# Patient Record
Sex: Male | Born: 1980 | Race: White | Hispanic: No | Marital: Single
Health system: Southern US, Community
[De-identification: ages and names within clinical notes are randomized; demographics above are authoritative.]

## PROBLEM LIST (undated history)

## (undated) DIAGNOSIS — M4800 Spinal stenosis, site unspecified: Secondary | ICD-10-CM

## (undated) DIAGNOSIS — G809 Cerebral palsy, unspecified: Secondary | ICD-10-CM

---
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
## 2002-01-19 ENCOUNTER — Encounter: Admission: RE | Admit: 2002-01-19 | Discharge: 2002-01-19 | Payer: Self-pay | Admitting: Psychiatry

## 2002-08-10 ENCOUNTER — Ambulatory Visit (HOSPITAL_COMMUNITY): Admission: RE | Admit: 2002-08-10 | Discharge: 2002-08-10 | Payer: Self-pay | Admitting: Pediatrics

## 2002-08-10 ENCOUNTER — Encounter: Payer: Self-pay | Admitting: Pediatrics

## 2004-08-21 ENCOUNTER — Encounter: Payer: Self-pay | Admitting: Internal Medicine

## 2004-10-02 ENCOUNTER — Ambulatory Visit (HOSPITAL_COMMUNITY): Payer: Self-pay | Admitting: Psychiatry

## 2005-01-01 ENCOUNTER — Ambulatory Visit (HOSPITAL_COMMUNITY): Payer: Self-pay | Admitting: Psychiatry

## 2005-06-26 ENCOUNTER — Ambulatory Visit (HOSPITAL_COMMUNITY): Payer: Self-pay | Admitting: Psychiatry

## 2005-06-26 ENCOUNTER — Ambulatory Visit (HOSPITAL_COMMUNITY): Admission: RE | Admit: 2005-06-26 | Discharge: 2005-06-26 | Payer: Self-pay | Admitting: Psychiatry

## 2005-08-22 ENCOUNTER — Ambulatory Visit (HOSPITAL_COMMUNITY): Payer: Self-pay | Admitting: Psychiatry

## 2005-10-28 ENCOUNTER — Ambulatory Visit (HOSPITAL_COMMUNITY): Payer: Self-pay | Admitting: Psychiatry

## 2005-12-04 ENCOUNTER — Ambulatory Visit (HOSPITAL_COMMUNITY): Admission: RE | Admit: 2005-12-04 | Discharge: 2005-12-04 | Payer: Self-pay | Admitting: Pediatrics

## 2005-12-30 ENCOUNTER — Ambulatory Visit (HOSPITAL_COMMUNITY): Payer: Self-pay | Admitting: Psychiatry

## 2006-04-07 ENCOUNTER — Ambulatory Visit (HOSPITAL_COMMUNITY): Payer: Self-pay | Admitting: Psychiatry

## 2007-01-08 ENCOUNTER — Encounter: Payer: Self-pay | Admitting: Internal Medicine

## 2007-02-25 ENCOUNTER — Ambulatory Visit (HOSPITAL_COMMUNITY): Payer: Self-pay | Admitting: Psychiatry

## 2007-05-25 ENCOUNTER — Ambulatory Visit: Payer: Self-pay | Admitting: Internal Medicine

## 2007-05-25 DIAGNOSIS — R569 Unspecified convulsions: Secondary | ICD-10-CM

## 2007-05-25 DIAGNOSIS — G809 Cerebral palsy, unspecified: Secondary | ICD-10-CM | POA: Insufficient documentation

## 2007-05-25 DIAGNOSIS — F329 Major depressive disorder, single episode, unspecified: Secondary | ICD-10-CM

## 2007-05-25 DIAGNOSIS — F909 Attention-deficit hyperactivity disorder, unspecified type: Secondary | ICD-10-CM | POA: Insufficient documentation

## 2007-06-02 ENCOUNTER — Ambulatory Visit (HOSPITAL_COMMUNITY): Payer: Self-pay | Admitting: Psychiatry

## 2007-06-03 ENCOUNTER — Encounter: Payer: Self-pay | Admitting: Internal Medicine

## 2007-06-08 ENCOUNTER — Ambulatory Visit (HOSPITAL_COMMUNITY): Admission: RE | Admit: 2007-06-08 | Discharge: 2007-06-08 | Payer: Self-pay | Admitting: Pediatrics

## 2007-06-22 ENCOUNTER — Encounter: Payer: Self-pay | Admitting: Internal Medicine

## 2007-07-08 ENCOUNTER — Telehealth: Payer: Self-pay | Admitting: Internal Medicine

## 2007-07-22 ENCOUNTER — Ambulatory Visit (HOSPITAL_COMMUNITY): Admission: RE | Admit: 2007-07-22 | Discharge: 2007-07-22 | Payer: Self-pay | Admitting: Pediatrics

## 2007-09-15 ENCOUNTER — Encounter: Payer: Self-pay | Admitting: Internal Medicine

## 2007-09-24 LAB — CONVERTED CEMR LAB
Basophils Absolute: 0 10*3/uL (ref 0.0–0.1)
Basophils Relative: 0 % (ref 0–1)
CO2: 29 meq/L (ref 19–32)
Carbamazepine Lvl: 7.8 ug/mL (ref 4.0–12.0)
Chloride: 104 meq/L (ref 96–112)
Cholesterol: 176 mg/dL (ref 0–200)
Creatinine, Ser: 0.94 mg/dL (ref 0.40–1.50)
Glucose, Bld: 74 mg/dL (ref 70–99)
HDL: 49 mg/dL (ref >39)
Hemoglobin: 16.8 g/dL (ref 13.0–17.0)
Lymphocytes Relative: 35 % (ref 12–46)
Lymphs Abs: 1.7 10*3/uL (ref 0.7–4.0)
Monocytes Relative: 9 % (ref 3–12)
Neutrophils Relative %: 54 % (ref 43–77)
Platelets: 237 10*3/uL (ref 150–400)
Potassium: 3.7 meq/L (ref 3.5–5.3)
RBC: 5.26 M/uL (ref 4.22–5.81)
RDW: 12.2 % (ref 11.5–15.5)
Total Bilirubin: 0.7 mg/dL (ref 0.3–1.2)
Total CHOL/HDL Ratio: 3.6
VLDL: 16 mg/dL (ref 0–40)

## 2007-10-05 ENCOUNTER — Ambulatory Visit (HOSPITAL_COMMUNITY): Payer: Self-pay | Admitting: Psychiatry

## 2008-01-24 ENCOUNTER — Ambulatory Visit (HOSPITAL_COMMUNITY): Payer: Self-pay | Admitting: Psychiatry

## 2008-04-17 ENCOUNTER — Encounter: Payer: Self-pay | Admitting: Internal Medicine

## 2010-05-30 ENCOUNTER — Telehealth: Payer: Self-pay | Admitting: *Deleted

## 2010-11-10 ENCOUNTER — Encounter: Payer: Self-pay | Admitting: Pediatrics

## 2010-11-19 NOTE — Letter (Signed)
Summary: On-Call Note  On-Call Note   Imported By: Maryln Gottron 04/03/2010 10:31:45  _____________________________________________________________________  External Attachment:    Type:   Image     Comment:   External Document

## 2010-11-19 NOTE — Letter (Signed)
Summary: Telephone Triage Note-Headache and Fever  Telephone Triage Note-Headache and Fever   Imported By: Maryln Gottron 04/03/2010 10:35:10  _____________________________________________________________________  External Attachment:    Type:   Image     Comment:   External Document

## 2010-11-19 NOTE — Progress Notes (Signed)
Summary: Note for jury duty  Phone Note Call from Patient Call back at Home Phone [phone]   Caller: Patient Summary of Call: Pt faxed over a note saying that he was summons for jury duty. He states that he doesn't feel capable due to his physical and intellectual challenges. He feels that he would have trouble staying focused and staying attentive. He has trouble remembering many things at one time, and feels that he would not be capable of making good decisions. He has difficulty following directions, so he would also have difficulty finding the courthouse, jury room, etc. He is wanting you to write a letter asking that he be excused from court due to his severe ADHD? He would appreciate it, because this makes him feel very uncomfortable. He will be happy to pick up the letter at our office. He needs to mail the letter by the end of Aug.  Initial call taken by: Romualdo Bolk, CMA Duncan Dull),  May 30, 2010 5:07 PM  Follow-up for Phone Call        Advise he  contat one of his specialists who have been caring for him    to write him a jury excuse as I  have not seen him in 3 years.   Follow-up by: Madelin Headings MD,  June 03, 2010 10:36 PM  Additional Follow-up for Phone Call Additional follow up Details #1::        Left message on machine about this. Additional Follow-up by: Romualdo Bolk, CMA (AAMA),  June 04, 2010 8:56 AM

## 2014-11-27 ENCOUNTER — Other Ambulatory Visit (HOSPITAL_COMMUNITY): Payer: Self-pay | Admitting: Pharmacist

## 2014-11-27 ENCOUNTER — Other Ambulatory Visit (HOSPITAL_COMMUNITY): Payer: Self-pay | Admitting: Internal Medicine

## 2014-11-27 DIAGNOSIS — S0990XS Unspecified injury of head, sequela: Secondary | ICD-10-CM

## 2014-11-28 ENCOUNTER — Emergency Department (HOSPITAL_COMMUNITY): Payer: BC Managed Care – PPO | Admitting: Anesthesiology

## 2014-11-28 ENCOUNTER — Inpatient Hospital Stay (HOSPITAL_COMMUNITY)
Admission: EM | Admit: 2014-11-28 | Discharge: 2014-11-29 | DRG: 032 | Disposition: A | Discharge status: Home / Self Care | Payer: BC Managed Care – PPO | Attending: Neurosurgery | Admitting: Neurosurgery

## 2014-11-28 ENCOUNTER — Ambulatory Visit (HOSPITAL_COMMUNITY)
Admission: RE | Admit: 2014-11-28 | Discharge: 2014-11-28 | Disposition: A | Discharge status: Home / Self Care | Payer: BC Managed Care – PPO | Source: Ambulatory Visit | Attending: Internal Medicine | Admitting: Internal Medicine

## 2014-11-28 ENCOUNTER — Emergency Department (HOSPITAL_COMMUNITY)
Admission: EM | Admit: 2014-11-28 | Discharge: 2014-11-28 | Discharge status: Absent without leave | Payer: BLUE CROSS/BLUE SHIELD

## 2014-11-28 ENCOUNTER — Emergency Department (HOSPITAL_COMMUNITY): Payer: BC Managed Care – PPO

## 2014-11-28 ENCOUNTER — Encounter (HOSPITAL_COMMUNITY)
Admission: EM | Disposition: A | Discharge status: Home / Self Care | Payer: Self-pay | Source: Home / Self Care | Attending: Neurosurgery

## 2014-11-28 DIAGNOSIS — T8502XA Displacement of ventricular intracranial (communicating) shunt, initial encounter: Secondary | ICD-10-CM

## 2014-11-28 DIAGNOSIS — G918 Other hydrocephalus: Secondary | ICD-10-CM | POA: Diagnosis present

## 2014-11-28 DIAGNOSIS — T85618A Breakdown (mechanical) of other specified internal prosthetic devices, implants and grafts, initial encounter: Secondary | ICD-10-CM

## 2014-11-28 DIAGNOSIS — Z79899 Other long term (current) drug therapy: Secondary | ICD-10-CM

## 2014-11-28 DIAGNOSIS — Y831 Surgical operation with implant of artificial internal device as the cause of abnormal reaction of the patient, or of later complication, without mention of misadventure at the time of the procedure: Secondary | ICD-10-CM | POA: Diagnosis present

## 2014-11-28 DIAGNOSIS — R519 Headache, unspecified: Secondary | ICD-10-CM

## 2014-11-28 DIAGNOSIS — G919 Hydrocephalus, unspecified: Secondary | ICD-10-CM | POA: Diagnosis present

## 2014-11-28 DIAGNOSIS — G9389 Other specified disorders of brain: Secondary | ICD-10-CM

## 2014-11-28 DIAGNOSIS — R51 Headache: Secondary | ICD-10-CM

## 2014-11-28 DIAGNOSIS — T8501XA Breakdown (mechanical) of ventricular intracranial (communicating) shunt, initial encounter: Secondary | ICD-10-CM | POA: Diagnosis present

## 2014-11-28 DIAGNOSIS — S0990XS Unspecified injury of head, sequela: Secondary | ICD-10-CM

## 2014-11-28 HISTORY — PX: VENTRICULOPERITONEAL SHUNT: SHX204

## 2014-11-28 LAB — PROTEIN AND GLUCOSE, CSF
Glucose, CSF: 64 mg/dL (ref 43–76)
TOTAL PROTEIN, CSF: 12 mg/dL — AB (ref 15–45)

## 2014-11-28 LAB — BASIC METABOLIC PANEL
Anion gap: 9 (ref 5–15)
BUN: 9 mg/dL (ref 6–23)
CALCIUM: 10 mg/dL (ref 8.4–10.5)
CO2: 28 mmol/L (ref 19–32)
CREATININE: 0.82 mg/dL (ref 0.50–1.35)
Chloride: 102 mmol/L (ref 96–112)
GFR calc Af Amer: >90 mL/min (ref >90)
Glucose, Bld: 100 mg/dL — ABNORMAL HIGH (ref 70–99)
POTASSIUM: 4 mmol/L (ref 3.5–5.1)
Sodium: 139 mmol/L (ref 135–145)

## 2014-11-28 LAB — CBC
HEMATOCRIT: 46 % (ref 39.0–52.0)
HEMOGLOBIN: 16.3 g/dL (ref 13.0–17.0)
MCH: 32.4 pg (ref 26.0–34.0)
MCHC: 35.4 g/dL (ref 30.0–36.0)
MCV: 91.5 fL (ref 78.0–100.0)
Platelets: 278 10*3/uL (ref 150–400)
RBC: 5.03 MIL/uL (ref 4.22–5.81)
RDW: 12.3 % (ref 11.5–15.5)
WBC: 6.4 10*3/uL (ref 4.0–10.5)

## 2014-11-28 LAB — GRAM STAIN

## 2014-11-28 LAB — PROTIME-INR
INR: 1.02 (ref 0.00–1.49)
Prothrombin Time: 13.5 seconds (ref 11.6–15.2)

## 2014-11-28 SURGERY — SHUNT INSERTION VENTRICULAR-PERITONEAL
Anesthesia: General

## 2014-11-28 MED ORDER — MIDAZOLAM HCL 2 MG/2ML IJ SOLN
INTRAMUSCULAR | Status: AC
  Filled 2014-11-28: qty 2

## 2014-11-28 MED ORDER — MIDAZOLAM HCL 2 MG/2ML IJ SOLN
INTRAMUSCULAR | Status: DC | PRN
  Administered 2014-11-28: 2 mg via INTRAVENOUS

## 2014-11-28 MED ORDER — FENTANYL CITRATE 0.05 MG/ML IJ SOLN
INTRAMUSCULAR | Status: AC
  Filled 2014-11-28: qty 5

## 2014-11-28 MED ORDER — CEFAZOLIN SODIUM-DEXTROSE 2-3 GM-% IV SOLR
INTRAVENOUS | Status: DC | PRN
  Administered 2014-11-28: 2 g via INTRAVENOUS

## 2014-11-28 MED ORDER — BACITRACIN ZINC 500 UNIT/GM EX OINT
TOPICAL_OINTMENT | CUTANEOUS | Status: DC | PRN
  Administered 2014-11-28: 1 via TOPICAL

## 2014-11-28 MED ORDER — GLYCOPYRROLATE 0.2 MG/ML IJ SOLN
INTRAMUSCULAR | Status: AC
  Filled 2014-11-28: qty 3

## 2014-11-28 MED ORDER — CEFAZOLIN SODIUM-DEXTROSE 2-3 GM-% IV SOLR
INTRAVENOUS | Status: AC
  Filled 2014-11-28: qty 50

## 2014-11-28 MED ORDER — SENNA 8.6 MG PO TABS
1.0000 | ORAL_TABLET | Freq: Two times a day (BID) | ORAL | Status: DC
  Filled 2014-11-28 (×2): qty 1

## 2014-11-28 MED ORDER — PROMETHAZINE HCL 25 MG/ML IJ SOLN: 6.2500 mg | INTRAMUSCULAR | Status: DC | PRN

## 2014-11-28 MED ORDER — HEMOSTATIC AGENTS (NO CHARGE) OPTIME
TOPICAL | Status: DC | PRN
  Administered 2014-11-28: 1 via TOPICAL

## 2014-11-28 MED ORDER — FLUOXETINE HCL 20 MG PO CAPS
60.0000 mg | ORAL_CAPSULE | Freq: Every day | ORAL | Status: DC
Start: 2014-11-29 — End: 2014-11-29
  Filled 2014-11-28: qty 3

## 2014-11-28 MED ORDER — LACTATED RINGERS IV SOLN
INTRAVENOUS | Status: DC | PRN
  Administered 2014-11-28 (×2): via INTRAVENOUS

## 2014-11-28 MED ORDER — PROPOFOL 10 MG/ML IV BOLUS
INTRAVENOUS | Status: DC | PRN
  Administered 2014-11-28: 160 mg via INTRAVENOUS

## 2014-11-28 MED ORDER — DOCUSATE SODIUM 100 MG PO CAPS
100.0000 mg | ORAL_CAPSULE | Freq: Two times a day (BID) | ORAL | Status: DC
  Filled 2014-11-28 (×2): qty 1

## 2014-11-28 MED ORDER — GLYCOPYRROLATE 0.2 MG/ML IJ SOLN
INTRAMUSCULAR | Status: DC | PRN
  Administered 2014-11-28: 0.6 mg via INTRAVENOUS

## 2014-11-28 MED ORDER — CARBAMAZEPINE 200 MG PO TABS: 200.0000 mg | ORAL_TABLET | Freq: Every day | ORAL | Status: DC

## 2014-11-28 MED ORDER — ONDANSETRON HCL 4 MG PO TABS: 4.0000 mg | ORAL_TABLET | Freq: Four times a day (QID) | ORAL | Status: DC | PRN

## 2014-11-28 MED ORDER — HEPARIN SODIUM (PORCINE) 5000 UNIT/ML IJ SOLN
5000.0000 [IU] | Freq: Three times a day (TID) | INTRAMUSCULAR | Status: DC
  Filled 2014-11-28 (×3): qty 1

## 2014-11-28 MED ORDER — NEOSTIGMINE METHYLSULFATE 10 MG/10ML IV SOLN
INTRAVENOUS | Status: AC
  Filled 2014-11-28: qty 1

## 2014-11-28 MED ORDER — ROCURONIUM BROMIDE 50 MG/5ML IV SOLN
INTRAVENOUS | Status: AC
  Filled 2014-11-28: qty 1

## 2014-11-28 MED ORDER — CARBAMAZEPINE 200 MG PO TABS
200.0000 mg | ORAL_TABLET | Freq: Every day | ORAL | Status: DC
  Filled 2014-11-28: qty 1

## 2014-11-28 MED ORDER — MORPHINE SULFATE 2 MG/ML IJ SOLN
2.0000 mg | INTRAMUSCULAR | Status: DC | PRN
  Administered 2014-11-29: 2 mg via INTRAVENOUS
  Filled 2014-11-28: qty 1

## 2014-11-28 MED ORDER — PROPOFOL 10 MG/ML IV BOLUS
INTRAVENOUS | Status: AC
  Filled 2014-11-28: qty 20

## 2014-11-28 MED ORDER — HYDROMORPHONE HCL 1 MG/ML IJ SOLN
INTRAMUSCULAR | Status: AC
Start: 2014-11-28 — End: 2014-11-28
  Filled 2014-11-28: qty 1

## 2014-11-28 MED ORDER — ONDANSETRON HCL 4 MG/2ML IJ SOLN: 4.0000 mg | Freq: Four times a day (QID) | INTRAMUSCULAR | Status: DC | PRN

## 2014-11-28 MED ORDER — THROMBIN 5000 UNITS EX SOLR
CUTANEOUS | Status: DC | PRN
  Administered 2014-11-28: 5000 [IU] via TOPICAL

## 2014-11-28 MED ORDER — NEOSTIGMINE METHYLSULFATE 10 MG/10ML IV SOLN
INTRAVENOUS | Status: DC | PRN
  Administered 2014-11-28: 4 mg via INTRAVENOUS

## 2014-11-28 MED ORDER — FENTANYL CITRATE 0.05 MG/ML IJ SOLN
INTRAMUSCULAR | Status: DC | PRN
  Administered 2014-11-28 (×3): 50 ug via INTRAVENOUS
  Administered 2014-11-28: 100 ug via INTRAVENOUS

## 2014-11-28 MED ORDER — CARBAMAZEPINE 200 MG PO TABS
400.0000 mg | ORAL_TABLET | Freq: Every day | ORAL | Status: DC
  Administered 2014-11-29: 400 mg via ORAL
  Filled 2014-11-28 (×2): qty 2

## 2014-11-28 MED ORDER — HYDROMORPHONE HCL 1 MG/ML IJ SOLN
0.2500 mg | INTRAMUSCULAR | Status: DC | PRN
  Administered 2014-11-28: 0.25 mg via INTRAVENOUS

## 2014-11-28 MED ORDER — HYDROCODONE-ACETAMINOPHEN 5-325 MG PO TABS
1.0000 | ORAL_TABLET | ORAL | Status: DC | PRN
  Administered 2014-11-29 (×3): 1 via ORAL
  Filled 2014-11-28 (×3): qty 1

## 2014-11-28 MED ORDER — ONDANSETRON HCL 4 MG/2ML IJ SOLN
INTRAMUSCULAR | Status: AC
  Filled 2014-11-28: qty 2

## 2014-11-28 MED ORDER — ROCURONIUM BROMIDE 100 MG/10ML IV SOLN
INTRAVENOUS | Status: DC | PRN
  Administered 2014-11-28 (×2): 10 mg via INTRAVENOUS
  Administered 2014-11-28: 20 mg via INTRAVENOUS

## 2014-11-28 MED ORDER — LIDOCAINE HCL (CARDIAC) 20 MG/ML IV SOLN
INTRAVENOUS | Status: DC | PRN
  Administered 2014-11-28: 60 mg via INTRAVENOUS

## 2014-11-28 MED ORDER — SUCCINYLCHOLINE CHLORIDE 20 MG/ML IJ SOLN
INTRAMUSCULAR | Status: DC | PRN
  Administered 2014-11-28: 100 mg via INTRAVENOUS

## 2014-11-28 MED ORDER — SODIUM CHLORIDE 0.9 % IV SOLN
INTRAVENOUS | Status: DC
  Administered 2014-11-28: via INTRAVENOUS

## 2014-11-28 MED ORDER — ONDANSETRON HCL 4 MG/2ML IJ SOLN
INTRAMUSCULAR | Status: DC | PRN
  Administered 2014-11-28: 4 mg via INTRAVENOUS

## 2014-11-28 SURGICAL SUPPLY — 82 items
BLADE CLIPPER SURG (BLADE) ×4 IMPLANT
BLADE SURG 10 STRL SS (BLADE) ×3 IMPLANT
BLADE SURG 11 STRL SS (BLADE) ×3 IMPLANT
BLADE SURG 15 STRL LF DISP TIS (BLADE) ×1 IMPLANT
BLADE SURG 15 STRL SS (BLADE) ×3
BOOT SUTURE AID YELLOW STND (SUTURE) ×3 IMPLANT
BRUSH SCRUB EZ 1% IODOPHOR (MISCELLANEOUS) ×1 IMPLANT
BUR ACORN 6.0 PRECISION (BURR) ×2 IMPLANT
BUR ACORN 6.0MM PRECISION (BURR) ×1
CANISTER SUCT 3000ML (MISCELLANEOUS) ×3 IMPLANT
CLIP RANEY DISP (INSTRUMENTS) IMPLANT
CLOSURE WOUND 1/2 X4 (GAUZE/BANDAGES/DRESSINGS) ×1
CLOSURE WOUND 1/4X4 (GAUZE/BANDAGES/DRESSINGS) ×1
CONT SPEC 4OZ CLIKSEAL STRL BL (MISCELLANEOUS) ×2 IMPLANT
CORDS BIPOLAR (ELECTRODE) ×3 IMPLANT
COVER MAYO STAND STRL (DRAPES) ×3 IMPLANT
DECANTER SPIKE VIAL GLASS SM (MISCELLANEOUS) ×3 IMPLANT
DRAPE INCISE IOBAN 66X45 STRL (DRAPES) ×3 IMPLANT
DRAPE ORTHO SPLIT 77X108 STRL (DRAPES) ×3
DRAPE POUCH INSTRU U-SHP 10X18 (DRAPES) ×3 IMPLANT
DRAPE PROXIMA HALF (DRAPES) ×3 IMPLANT
DRAPE SURG ORHT 6 SPLT 77X108 (DRAPES) ×1 IMPLANT
DRSG OPSITE 4X5.5 SM (GAUZE/BANDAGES/DRESSINGS) ×4 IMPLANT
DRSG OPSITE POSTOP 4X6 (GAUZE/BANDAGES/DRESSINGS) ×2 IMPLANT
DRSG TELFA 3X8 NADH (GAUZE/BANDAGES/DRESSINGS) IMPLANT
DURAPREP 26ML APPLICATOR (WOUND CARE) ×6 IMPLANT
ELECT CAUTERY BLADE 6.4 (BLADE) ×3 IMPLANT
ELECT REM PT RETURN 9FT ADLT (ELECTROSURGICAL) ×3
ELECTRODE REM PT RTRN 9FT ADLT (ELECTROSURGICAL) ×1 IMPLANT
GAUZE SPONGE 2X2 8PLY STRL LF (GAUZE/BANDAGES/DRESSINGS) IMPLANT
GAUZE SPONGE 4X4 12PLY STRL (GAUZE/BANDAGES/DRESSINGS) ×3 IMPLANT
GAUZE SPONGE 4X4 16PLY XRAY LF (GAUZE/BANDAGES/DRESSINGS) ×6 IMPLANT
GLOVE BIO SURGEON STRL SZ 6 (GLOVE) ×4 IMPLANT
GLOVE BIO SURGEON STRL SZ 6.5 (GLOVE) ×2 IMPLANT
GLOVE BIO SURGEONS STRL SZ 6.5 (GLOVE) ×2
GLOVE ECLIPSE 7.0 STRL STRAW (GLOVE) ×3 IMPLANT
GLOVE EXAM NITRILE LRG STRL (GLOVE) IMPLANT
GLOVE EXAM NITRILE MD LF STRL (GLOVE) IMPLANT
GLOVE EXAM NITRILE XL STR (GLOVE) IMPLANT
GLOVE EXAM NITRILE XS STR PU (GLOVE) IMPLANT
GLOVE INDICATOR 7.5 STRL GRN (GLOVE) IMPLANT
GLOVE SURG SS PI 7.0 STRL IVOR (GLOVE) ×4 IMPLANT
GOWN STRL REUS W/ TWL LRG LVL3 (GOWN DISPOSABLE) ×2 IMPLANT
GOWN STRL REUS W/ TWL XL LVL3 (GOWN DISPOSABLE) IMPLANT
GOWN STRL REUS W/TWL 2XL LVL3 (GOWN DISPOSABLE) IMPLANT
GOWN STRL REUS W/TWL LRG LVL3 (GOWN DISPOSABLE) ×6
GOWN STRL REUS W/TWL XL LVL3 (GOWN DISPOSABLE) ×3
HEMOSTAT SURGICEL 2X14 (HEMOSTASIS) IMPLANT
KIT BASIN OR (CUSTOM PROCEDURE TRAY) ×3 IMPLANT
KIT ROOM TURNOVER OR (KITS) ×3 IMPLANT
MARKER SKIN DUAL TIP RULER LAB (MISCELLANEOUS) ×3 IMPLANT
NDL HYPO 25X1 1.5 SAFETY (NEEDLE) ×1 IMPLANT
NEEDLE HYPO 25X1 1.5 SAFETY (NEEDLE) ×3 IMPLANT
NS IRRIG 1000ML POUR BTL (IV SOLUTION) ×3 IMPLANT
PACK EENT II TURBAN DRAPE (CUSTOM PROCEDURE TRAY) ×3 IMPLANT
PAD ARMBOARD 7.5X6 YLW CONV (MISCELLANEOUS) ×9 IMPLANT
PAD DRESSING TELFA 3X8 NADH (GAUZE/BANDAGES/DRESSINGS) ×1 IMPLANT
PASSER CATH 65CM DISP (NEUROSURGERY SUPPLIES) ×3 IMPLANT
PATTIES SURGICAL .5 X.5 (GAUZE/BANDAGES/DRESSINGS) IMPLANT
PENCIL BUTTON HOLSTER BLD 10FT (ELECTRODE) ×3 IMPLANT
SPONGE GAUZE 2X2 STER 10/PKG (GAUZE/BANDAGES/DRESSINGS) ×2
SPONGE LAP 4X18 X RAY DECT (DISPOSABLE) ×3 IMPLANT
SPONGE SURGIFOAM ABS GEL 12-7 (HEMOSTASIS) IMPLANT
STAPLER SKIN PROX WIDE 3.9 (STAPLE) ×3 IMPLANT
STRIP CLOSURE SKIN 1/2X4 (GAUZE/BANDAGES/DRESSINGS) ×1 IMPLANT
STRIP CLOSURE SKIN 1/4X4 (GAUZE/BANDAGES/DRESSINGS) ×2 IMPLANT
SUT BONE WAX W31G (SUTURE) ×3 IMPLANT
SUT ETHILON 3 0 PS 1 (SUTURE) ×3 IMPLANT
SUT NURALON 4 0 TR CR/8 (SUTURE) ×2 IMPLANT
SUT SILK 0 TIES 10X30 (SUTURE) ×3 IMPLANT
SUT SILK 3 0 SH 30 (SUTURE) IMPLANT
SUT VIC AB 2-0 CT2 18 VCP726D (SUTURE) ×3 IMPLANT
SUT VIC AB 3-0 SH 8-18 (SUTURE) ×5 IMPLANT
SYR BULB 3OZ (MISCELLANEOUS) ×3 IMPLANT
SYR CONTROL 10ML LL (SYRINGE) ×3 IMPLANT
TOWEL OR 17X24 6PK STRL BLUE (TOWEL DISPOSABLE) ×3 IMPLANT
TOWEL OR 17X26 10 PK STRL BLUE (TOWEL DISPOSABLE) ×3 IMPLANT
TUBE CONNECTING 12'X1/4 (SUCTIONS) ×1
TUBE CONNECTING 12X1/4 (SUCTIONS) ×2 IMPLANT
UNDERPAD 30X30 INCONTINENT (UNDERPADS AND DIAPERS) ×1 IMPLANT
VALVE PROGRAM W DISTAL CATH (Prosthesis & Implant Heart) ×2 IMPLANT
WATER STERILE IRR 1000ML POUR (IV SOLUTION) ×3 IMPLANT

## 2014-11-28 NOTE — ED Provider Notes (Signed)
CSN: [id number]     Arrival date & time 11/28/14  1448 History   First MD Initiated Contact with Patient 11/28/14 1620     Chief Complaint  Patient presents with  . 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  His last revision was in 2008 at [facility].  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  The family would prefer to stay here in [place] if this could be dealt with here.  He presented to his family doctor today with headache over the past 2 weeks after minor trauma 2 weeks ago.  At that time he did not strike his head but landed hard on the ice.  He been having increasing headaches since then and a head CT performed as an outpatient demonstrated acute ventriculomegaly with some edema and he was sent to the ER for further evaluation.  He does admit headache at this time.  Family reports no significant altered mental status.  Family has reported some new mild lethargy.  Patient is not on any anticoagulants.   No past medical history on file. No past surgical history on file. No family history on file. History  Substance Use Topics  . Smoking status: Not on file  . Smokeless tobacco: Not on file  . Alcohol Use: Not on file    Review of Systems  All other systems reviewed and are negative.     Allergies  Erythromycin  Home Medications   Prior to Admission medications   Not on File   BP 125/77 mmHg  Pulse 66  Temp(Src) 98.2 F (36.8 C) (Oral)  Resp 16  SpO2 96% Physical Exam  Constitutional: He is oriented to person, place, and time. He appears well-developed and well-nourished.  HENT:  Head: Normocephalic and atraumatic.  Eyes: EOM are normal. Pupils are equal, round, and reactive to light.  Neck: Normal range of motion.  Cardiovascular: Normal rate, regular rhythm, normal heart sounds  and intact distal pulses.   Pulmonary/Chest: Effort normal and breath sounds normal. No respiratory distress.  Abdominal: Soft. He exhibits no distension. There is no tenderness.  Musculoskeletal: Normal range of motion.  Neurological: He is alert and oriented to person, place, and time.  5/5 strength in major muscle groups of  bilateral upper and lower extremities. Speech normal. No facial asymetry.   Skin: Skin is warm and dry.  Psychiatric: He has a normal mood and affect. Judgment normal.  Nursing note and vitals reviewed.   ED Course  Procedures (including critical care time) Labs Review Labs Reviewed  BASIC METABOLIC PANEL - Abnormal; Notable for the following:    Glucose, Bld 100 (*)    All other components within normal limits  CBC    Imaging Review Dg Neck Soft Tissue  11/28/2014   CLINICAL DATA:  Malfunctioning shunt.  EXAM: NECK SOFT TISSUES - 1+ VIEW  COMPARISON:  CT scan of November 28, 2014.  FINDINGS: There is no evidence of retropharyngeal soft tissue swelling or epiglottic enlargement. The cervical airway is unremarkable and no radio-opaque foreign body identified. Shunt tubing is not visualized. Mild degenerative disc disease is noted at C5-6.  IMPRESSION: No definite abnormality seen. Shunt tubing is not visualized on these images.   Electronically Signed   By: Lupita Raider, M.D.   On: 11/28/2014 17:49  Dg Chest 1 View  11/28/2014   CLINICAL DATA:  Malfunctioning shunt.  EXAM: CHEST  1 VIEW  COMPARISON:  None.  FINDINGS: The heart size and mediastinal contours are within normal limits. Both lungs are clear. No pneumothorax or pleural effusion is noted. No shunt is noted. The visualized skeletal structures are unremarkable.  IMPRESSION: No acute cardiopulmonary abnormality seen. Shunt tubing is not visualized.   Electronically Signed   By: Lupita Raider, M.D.   On: 11/28/2014 17:52   Dg Abd 1 View  11/28/2014   CLINICAL DATA:  Malfunctioning shunt.  EXAM: ABDOMEN - 1  VIEW  COMPARISON:  None.  FINDINGS: The bowel gas pattern is normal. Ventriculoperitoneal shunt appears to be coiled within the abdomen, after apparently being fully retracted into the peritoneal space. No abnormal calcifications are noted. Stool is noted throughout the colon.  IMPRESSION: No evidence of bowel obstruction or ileus. Entire ventriculoperitoneal shunt tubing is coiled within the abdomen, after being fully retracted into the peritoneal space.   Electronically Signed   By: Lupita Raider, M.D.   On: 11/28/2014 17:54   Ct Head Wo Contrast  11/28/2014   ADDENDUM REPORT: 11/28/2014 14:04  ADDENDUM: Critical Value/emergent results were called by telephone at the time of interpretation on 11/28/2014 at 1356 hours to Dr. Merri Brunette , who verbally acknowledged these results.   Electronically Signed   By: Odessa Fleming M.D.   On: 11/28/2014 14:04   11/28/2014   CLINICAL DATA:  34 year old male with head trauma, frontal headaches, fell on ice 2 weeks ago. History of CSF shunt. Initial encounter.  EXAM: CT HEAD WITHOUT CONTRAST  TECHNIQUE: Contiguous axial images were obtained from the base of the skull through the vertex without intravenous contrast.  COMPARISON:  07/22/2007 and earlier.  FINDINGS: New opacification of the dominant right sphenoid sinus. Some hyperdense retained secretions. Other Visualized paranasal sinuses and mastoids are clear.  Stable visualized osseous structures. Right posterior approach burr hole and shunt. Superficial scalp soft tissues are stable and within normal limits. Visualized orbit soft tissues are within normal limits.  Moderate to severe ventriculomegaly, of the change from the prior study in 2008 at which time the lateral and third ventricles were almost completely decompressed. Abnormal periventricular hypodensity in keeping with transependymal edema including at the temporal horns. Intracranial portion of the CSF shunt appears stable in position, terminating near the anterior  septum callosum on the right.  No extra-axial fluid collection identified. No discrete brain mass identified. No evidence of cortically based acute infarction identified. No acute intracranial hemorrhage identified.  IMPRESSION: 1. Shunt malfunction with acute ventriculomegaly and transependymal edema. 2. Shunt positioning appears stable since 2008. 3. New sphenoid sinus disease.  Electronically Signed: By: Odessa Fleming M.D. On: 11/28/2014 13:37  I personally reviewed the imaging tests through PACS system I reviewed available ER/hospitalization records through the EMR    EKG Interpretation None      MDM   Final diagnoses:  Shunt malfunction, initial encounter  Cerebral ventriculomegaly  Headache, unspecified headache type  Displacement of ventricular intracranial shunt, initial encounter    I will contact the neurosurgical team here and see if this is something they can be managed here at this hospital.  Additional shunt series will be performed including soft tissue neck, chest, abdomen as this is an intraperitoneal ventricular shunt  6:14 PM Spoke with Dr Conchita Paris who will admit for shunt revision. Appears to have retracted into the peritoneal space  Lyanne CoKevin M Sasuke Yaffe, MD 11/28/14 [phone]

## 2014-11-28 NOTE — Anesthesia Procedure Notes (Signed)
Procedure Name: Intubation Date/Time: 11/28/2014 8:27 PM Performed by: Orvilla FusATO, Myndi Wamble A Pre-anesthesia Checklist: Patient identified, Timeout performed, Emergency Drugs available, Suction available and Patient being monitored Patient Re-evaluated:Patient Re-evaluated prior to inductionOxygen Delivery Method: Circle system utilized Preoxygenation: Pre-oxygenation with 100% oxygen Intubation Type: IV induction Ventilation: Mask ventilation without difficulty Laryngoscope Size: Mac and 3 Grade View: Grade I Tube type: Oral Tube size: 7.5 mm Number of attempts: 1 Airway Equipment and Method: Stylet Placement Confirmation: ETT inserted through vocal cords under direct vision,  breath sounds checked- equal and bilateral and positive ETCO2 Secured at: 22 cm Tube secured with: Tape Dental Injury: Teeth and Oropharynx as per pre-operative assessment

## 2014-11-28 NOTE — Op Note (Addendum)
PRE-OPERATIVE DIAGNOSIS: hydrocephalus  POST-OPERATIVE DIAGNOSIS:  Same  PROCEDURE:  Procedure(s): Peritoneal access for VP shunt  SURGEON:  Surgeon(s): Almond LintFaera Kailo Kosik, MD  Co-Surgeon:   Lisbeth RenshawNundkumar, Neelesh MD  ANESTHESIA:   general  DRAINS: Codman VP shunt   LOCAL MEDICATIONS USED:  NONE  SPECIMEN:  Source of Specimen:  CSF fluid sent by Dr. Conchita ParisNundkumar  DISPOSITION OF SPECIMEN:  micro  COUNTS:  YES  DICTATION: .Dragon Dictation  PLAN OF CARE: Admit to inpatient   PATIENT DISPOSITION:  PACU - hemodynamically stable.  FINDINGS:  Omental adhesions medially  EBL: min  PROCEDURE:  Pt was identified in the holding area and taken to the operating room where he was placed supine on the OR table.  He was intubated and placed into position by the neuro team.  The patient's head, neck, chest, and abdomen were prepped and draped in sterile fashion.  Time out was performed according to the surgical safety checklist.  When all was correct, we continued.    Dr. Conchita ParisNundkumar removed the previous valve and got good flow from the Ventriculostomy.  I made a 3 cm vertical incision laterally at the costal margin.  I dissected through the muscle layers, spreading instead of cutting.  The peritoneum was clamped with an allis clamp and elevated.  Metzenbaum scissors were used to incise the peritoneum.  The catheter was passed through the subcutaneous space from the right scalp to the abdominal incision.  The catheter was hooked up to the ventriculostomy.  Good flow was observed.  The catheter was then passed into the peritoneal cavity in a caudal direction easily.  Adhesions were noted medially from the omentum.  The muscles were reapproximated in 3 layers.  The skin was then closed with 3-0 vicryl deep dermal interrupted sutures and skin staples. The wound was dressed with gauze and Tegaderm.   The patient tolerated the procedure well.    He was allowed to emerge from anesthesia and taken to the OR in  stable condition.  Needle, sponge, and instrument counts were correct times two.

## 2014-11-28 NOTE — Consult Note (Signed)
CC:  Chief Complaint  Patient presents with  . Neurologic Problem    HPI: Sean Cherry is a 34 y.o. male sent to the ED after undergoing outpatient CT of the head. 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 He has had multiple revisions in the past, with the last being in 2008 at which time a right parietal VPS was placed. His HPI begins about 2 weeks ago when he fell during the snowstorm and felt jarring up his entire body. Since then he has noted slowly increasing HA. His mother notes that over the past few days he has been sleeping more and more, and today didn't really get out of bed. He has not had any fevers, chills.  PMH: No past medical history on file.  PSH: No past surgical history on file.  Has had multiple shunt revisions in the past, last in 2008 at [facility].  SH: History  Substance Use Topics  . Smoking status: Not on file  . Smokeless tobacco: Not on file  . Alcohol Use: Not on file    MEDS: Prior to Admission medications   Medication Sig Start Date End Date Taking? Authorizing Provider  FLUoxetine (PROZAC) 20 MG tablet Take 60 mg by mouth daily.  11/01/14  Yes Historical Provider, MD  TEGRETOL 200 MG tablet Take 200-400 mg by mouth daily. Take 200mg  every morning and 400mg  at bedtime 11/01/14  Yes Historical Provider, MD    ALLERGY: No Known Allergies  ROS: ROS  NEUROLOGIC EXAM: Awake, alert, oriented Memory and concentration grossly intact Speech fluent, appropriate CN grossly intact Motor exam: Upper Extremities Deltoid Bicep Tricep Grip  Right 5/5 5/5 5/5 5/5  Left 5/5 5/5 5/5 5/5   Lower Extremity IP Quad PF DF EHL  Right 5/5 5/5 5/5 5/5 5/5  Left 5/5 5/5 5/5 5/5 5/5   Sensation grossly intact to LT  Right parietal shunt valve palpable, pumps and refills briskly  IMGAING: CTH reviewed, with significantly increased ventricular size in comparison to prior CT of 2008 demonstrating collapse of the lateral  ventricles.  Neck/chest/abd XR reviewed. No shunt tubing is visualized across the neck or chest, and coiled catheter is seen within the abdomen.  IMPRESSION: 36- 34 y.o. male with shunt malfunction likely due to retraction of distal shunt tubing into the peritoneum  PLAN: - To OR for shunt revision  The imaging findings and need for shunt revision were reviewed with the patient and his mother. They are very familiar with the shunt revision surgery, its indications, and risks and benefits. Consent was obtained after all questions were answered.

## 2014-11-28 NOTE — Anesthesia Preprocedure Evaluation (Signed)
Anesthesia Evaluation  Patient identified by MRN, date of birth, ID band Patient awake    Reviewed: Allergy & Precautions, NPO status , Patient's Chart, lab work & pertinent test results  Airway Mallampati: II  TM Distance: >3 FB Neck ROM: Limited    Dental no notable dental hx.    Pulmonary neg pulmonary ROS,  breath sounds clear to auscultation  Pulmonary exam normal       Cardiovascular negative cardio ROS  Rhythm:Regular Rate:Normal     Neuro/Psych Seizures -,  CP negative psych ROS   GI/Hepatic negative GI ROS, Neg liver ROS,   Endo/Other  negative endocrine ROS  Renal/GU negative Renal ROS  negative genitourinary   Musculoskeletal negative musculoskeletal ROS (+)   Abdominal   Peds negative pediatric ROS (+)  Hematology negative hematology ROS (+)   Anesthesia Other Findings   Reproductive/Obstetrics negative OB ROS                             Anesthesia Physical Anesthesia Plan  ASA: II  Anesthesia Plan: General   Post-op Pain Management:    Induction: Intravenous  Airway Management Planned: Oral ETT  Additional Equipment:   Intra-op Plan:   Post-operative Plan: Extubation in OR  Informed Consent: I have reviewed the patients History and Physical, chart, labs and discussed the procedure including the risks, benefits and alternatives for the proposed anesthesia with the patient or authorized representative who has indicated his/her understanding and acceptance.   Dental advisory given  Plan Discussed with: CRNA and Surgeon  Anesthesia Plan Comments:         Anesthesia Quick Evaluation

## 2014-11-28 NOTE — Progress Notes (Signed)
Assessment done at 2025.

## 2014-11-28 NOTE — Op Note (Addendum)
PREOP DIAGNOSIS:  1. Non-obstructive hydrocephalus 2. Shunt malfunction  POSTOP DIAGNOSIS: Same  PROCEDURE: 1. Revision of right parietal ventriculoperitoneal shunt  SURGEON: Dr. Lisbeth RenshawNeelesh Danielys Madry, MD  CO-SURGEON: Dr. Raenette RoverSarah Byerly  ANESTHESIA: General Endotracheal  EBL: Minimal  SPECIMENS: CSF for glucose, protein, gram stain, culture  DRAINS: None  COMPLICATIONS: None immediate  CONDITION: Stable to ICU  SHUNT PLACED: Codman Hakim set to 130  HISTORY: Sean Cherry is a 34 y.o. male who initially presented to the emergency department after several days of progressively worsening headache and lethargy. CT scan was done which demonstrated significant ventriculomegaly in comparison to prior CT scan. Shunt series was also completed which demonstrated retraction of the distal catheter into the peritoneal cavity. With these findings, shunt revision was indicated. The risks and benefits of the surgery were explained in detail to the patient and his mother. After all questions were answered, informed consent was obtained.  PROCEDURE IN DETAIL: The patient was brought to the operating room and transferred to the operative table. After induction of general anesthesia, the patient was positioned on the operative table in the supine position with all pressure points meticulously padded. The skin of the scalp,  neck, chest, and abdomen were prepped in the usual sterile fashion.  The previously made right parietal occipital scalp incision was opened sharply, and the previous proximal shunt catheter and proximal valve was identified.  The proximal catheter was then cut, and good CSF flow was noted. Specimen was taken and sent for routine culture. The valve was then removed, and the distal catheter at the attachment to the distal valve was noted to be fractured. A tunnel was then created using a hemostat subcutaneously to the neck.  The shunt passer was then used to tunnel into the right upper  quadrant.   The distal shunt catheter and valve assembly was then passed through to the abdominal incision. The Hakim valve was previously set to 130 and was connected to the prior proximal catheter and secured with Nurulon. Good CSF flow through the shunt apparatus was observed. Access to the peritoneum was obtained by Dr. Donell BeersByerly, details of which are dictated in a separate report. The distal end of the catheter was then placed into the peritoneal cavity.  At this point the wounds were irrigated with copious amounts of bacitracin irrigation, and closed using 3-0 Vicryl stitches.  The skin was then closed using standard surgical skin staples.  Sterile dressings were then applied.  At the end of the case all sponge needle and instrument counts were correct.  The patient tolerated the procedure well and was extubated in the room and taken to the postanesthesia care unit in stable condition.

## 2014-11-28 NOTE — ED Notes (Signed)
Onset several weeks ago pt slipped in snow/ice fell down on buttocks, felt jarring from buttocks to head, since then has had headache and sensitive to light. No other s/s noted.

## 2014-11-28 NOTE — Anesthesia Postprocedure Evaluation (Signed)
  Anesthesia Post-op Note  Patient: Sean Cherry  Procedure(s) Performed: Procedure(s) (LRB): Shunt Revision  (N/A)  Patient Location: PACU  Anesthesia Type: General  Level of Consciousness: awake and alert   Airway and Oxygen Therapy: Patient Spontanous Breathing  Post-op Pain: mild  Post-op Assessment: Post-op Vital signs reviewed, Patient's Cardiovascular Status Stable, Respiratory Function Stable, Patent Airway and No signs of Nausea or vomiting  Last Vitals:  Filed Vitals:   11/28/14 2202  BP: 124/76  Pulse: 78  Temp: 36.7 C  Resp: 15    Post-op Vital Signs: stable   Complications: No apparent anesthesia complications

## 2014-11-28 NOTE — ED Notes (Addendum)
Sent here from Dr. Norma FredricksonFarr for CT scan because of shunt failure. Having major h/a and lethargic. Fell x 2 weeks ago. Has enlarged ventricles.

## 2014-11-28 NOTE — ED Notes (Signed)
[facility]

## 2014-11-28 NOTE — Transfer of Care (Signed)
Immediate Anesthesia Transfer of Care Note  Patient: Sean Cherry  Procedure(s) Performed: Procedure(s): Shunt Revision  (N/A)  Patient Location: PACU  Anesthesia Type:General  Level of Consciousness: awake, alert  and oriented  Airway & Oxygen Therapy: Patient Spontanous Breathing and Patient connected to nasal cannula oxygen  Post-op Assessment: Report given to RN, Post -op Vital signs reviewed and stable and Patient moving all extremities  Post vital signs: Reviewed and stable  Last Vitals:  Filed Vitals:   11/28/14 1945  BP: 116/70  Pulse: 58  Temp:   Resp: 16    Complications: No apparent anesthesia complications

## 2014-11-29 ENCOUNTER — Encounter (HOSPITAL_COMMUNITY): Payer: Self-pay | Admitting: Neurosurgery

## 2014-11-29 LAB — MRSA PCR SCREENING: MRSA by PCR: NEGATIVE

## 2014-11-29 MED ORDER — HYDROCODONE-ACETAMINOPHEN 5-325 MG PO TABS: 1.0000 | ORAL_TABLET | ORAL | Status: AC | PRN

## 2014-11-29 NOTE — Progress Notes (Signed)
eLink Physician-Brief Progress Note Patient Name: Sean Cherry A Naumann DOB: Feb 28, 1981 MRN: [id number]   Date of Service  11/29/2014  HPI/Events of Note  [age] M presenting to ICU for post shunt revision monitoring.  Patient is alert, HD stable in NAD  eICU Interventions  Plan of care per primary NS team Will continue to monitor via [facility]     Intervention Category Evaluation Type: New Patient Evaluation  Ousmane Seeman 11/29/2014, 12:04 AM

## 2014-11-29 NOTE — Discharge Summary (Signed)
  Physician Discharge Summary  Patient ID: Sean Cherry MRN: [id number] DOB/AGE: 231-13-1982 34 y.o.  Admit date: 11/28/2014 Discharge date: 11/29/2014  Admission Diagnoses:  1. Shunt malfunction 2. Hydrocephalus  Discharge Diagnoses: Same Active Problems:   Hydrocephalus   Discharged Condition: Stable  Hospital Course:  Mrs. Sean Cherry is a 34 y.o. male admitted through the ED after CT scan demonstrated HCP. He underwent shunt revision and was at neurologic baseline postop. He was ambulating well, tolerating diet, and voiding normally.  Treatments: Surgery - Shunt revision  Discharge Exam: Blood pressure 98/53, pulse 58, temperature 97.7 F (36.5 C), temperature source Oral, resp. rate 13, height 5\' 7"  (1.702 m), weight 58.2 kg (128 lb 4.9 oz), SpO2 96 %. Awake, alert, oriented Speech fluent, appropriate CN grossly intact 5/5 BUE/BLE Wound c/d/i  Follow-up: Follow-up in my office [facility] [phone]) in 10-14 days  Disposition: Home     Medication List    TAKE these medications        FLUoxetine 20 MG tablet  Commonly known as:  PROZAC  Take 60 mg by mouth daily.     HYDROcodone-acetaminophen 5-325 MG per tablet  Commonly known as:  NORCO/VICODIN  Take 1-2 tablets by mouth every 4 (four) hours as needed for moderate pain.     TEGRETOL 200 MG tablet  Generic drug:  carbamazepine  Take 200-400 mg by mouth daily. Take 200mg  every morning and 400mg  at bedtime         Signed: Sharrod Achille, C 11/29/2014, 9:10 AM

## 2014-11-29 NOTE — Progress Notes (Signed)
UR completed.  Chrystel Barefield, RN BSN M[facility] CCM Trauma/Neuro ICU Case Manager [phone]  

## 2014-11-29 NOTE — Plan of Care (Signed)
Problem: Discharge Progression Outcomes Goal: Staples/sutures removed Outcome: Adequate for Discharge Dr. Conchita ParisNundkumar will remove in follow-up visit.

## 2014-11-29 NOTE — Plan of Care (Signed)
Problem: Phase III Progression Outcomes Goal: Pain controlled on oral analgesia Outcome: Completed/Met Date Met:  11/29/14 vicodin 1-2 q 4 hours     

## 2014-12-03 LAB — CSF CULTURE

## 2014-12-03 LAB — CSF CULTURE W GRAM STAIN: Culture: NO GROWTH

## 2016-06-05 IMAGING — DX DG CHEST 1V
1 series · 1 of 1 positions shown · non-contrast
Comparison: None.

CLINICAL DATA: Malfunctioning shunt.

EXAM:
CHEST  1 VIEW

[chest ap]
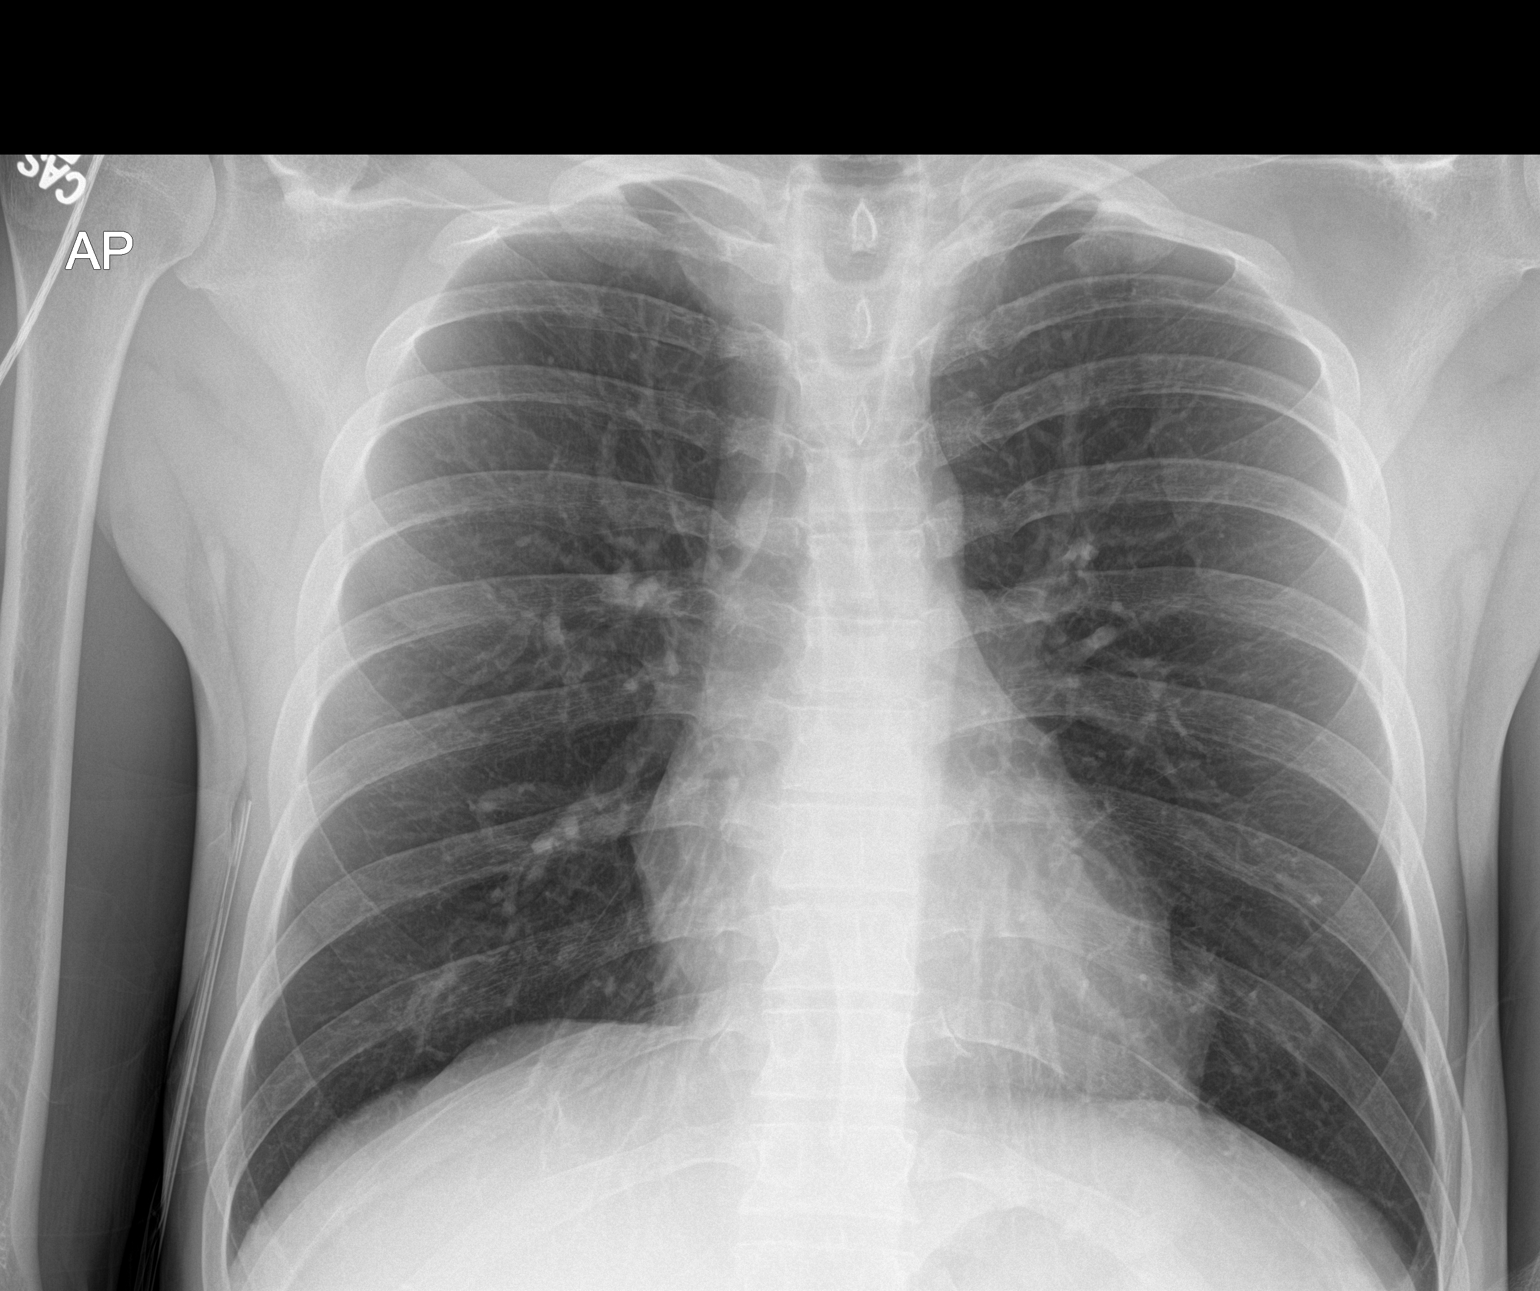

[1 of 1 positions shown; findings below may reference images not displayed]

FINDINGS: The heart size and mediastinal contours are within normal limits.
Both lungs are clear. No pneumothorax or pleural effusion is noted.
No shunt is noted. The visualized skeletal structures are
unremarkable.
IMPRESSION: No acute cardiopulmonary abnormality seen. Shunt tubing is not
visualized.

## 2016-06-05 IMAGING — CT CT HEAD W/O CM
1 of 3 series · 11 of 30 positions shown, 14 images · non-contrast
Comparison: 07/22/2007 and earlier.

ADDENDUM:
Critical Value/emergent results were called by telephone at the time
of interpretation on 11/28/2014 at 3876 hours to Dr. ADEIYE MIDE ,
who verbally acknowledged these results.
CLINICAL DATA: 34-year-old male with head trauma, frontal
headaches, fell on ice 2 weeks ago. History of CSF shunt. Initial
encounter.

EXAM:
CT HEAD WITHOUT CONTRAST
TECHNIQUE: Contiguous axial images were obtained from the base of the skull
through the vertex without intravenous contrast.

[Series 2: head 5.0 h30s · axial · 0.44mm/px · z∈[+1090,+1215]mm · 11 of 31 slices shown, 14 images]
[im 3/31  brain]
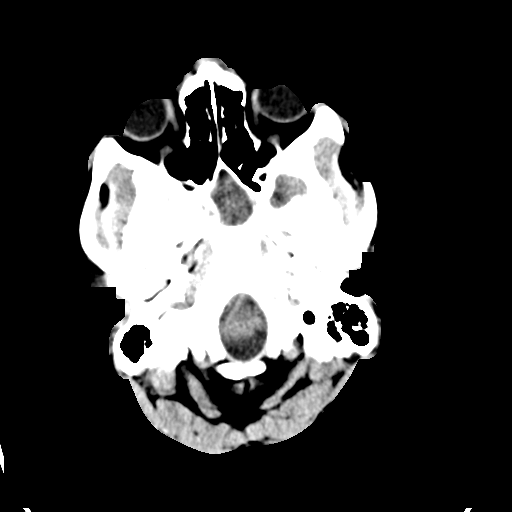
[im 3/31  bone]
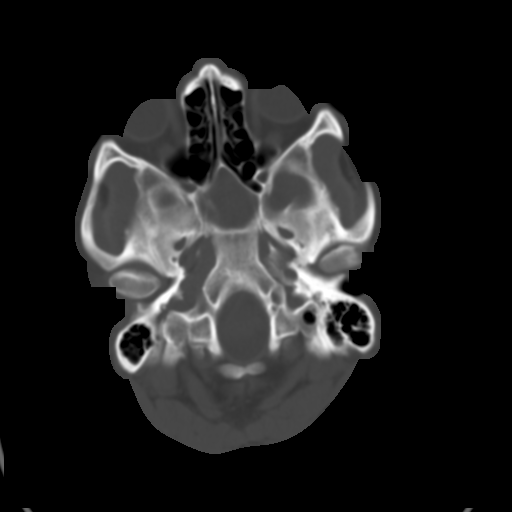
[im 6/31  brain]
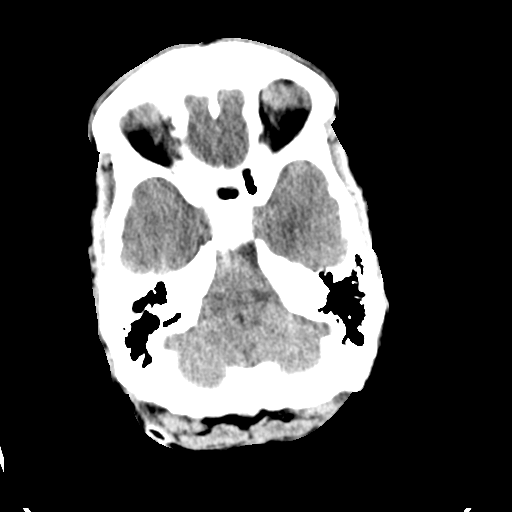
[im 8/31  brain]
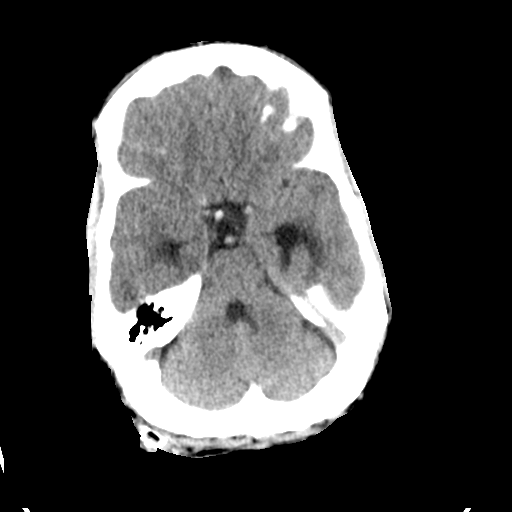
[im 11/31  brain]
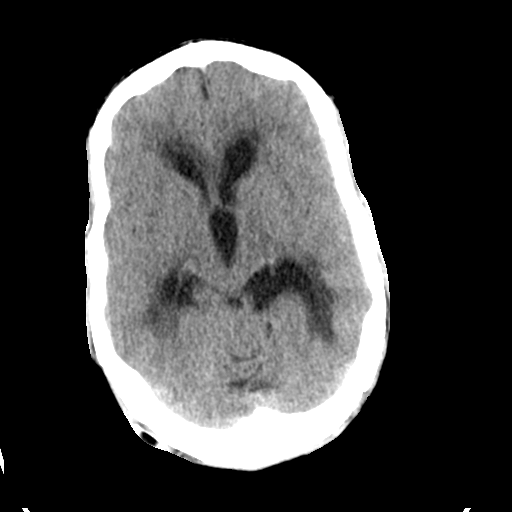
[im 13/31  brain]
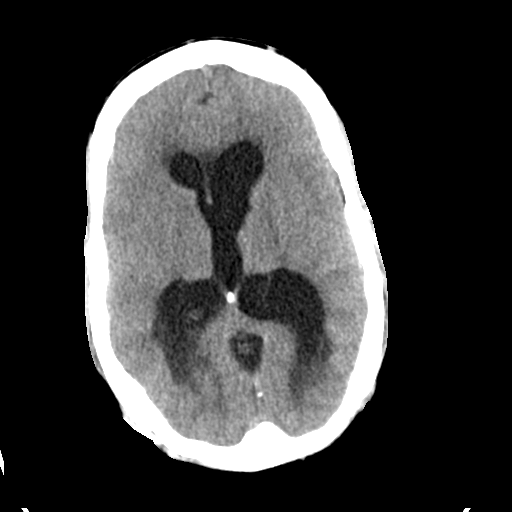
[im 13/31  bone]
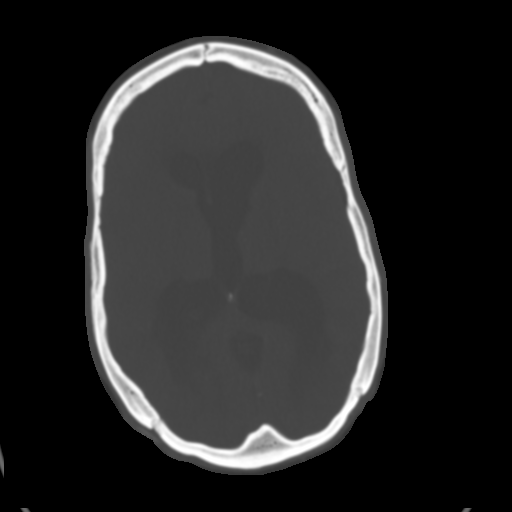
[im 16/31  brain]
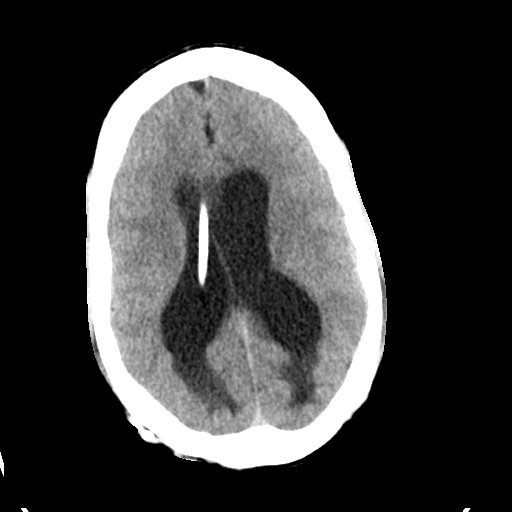
[im 18/31  brain]
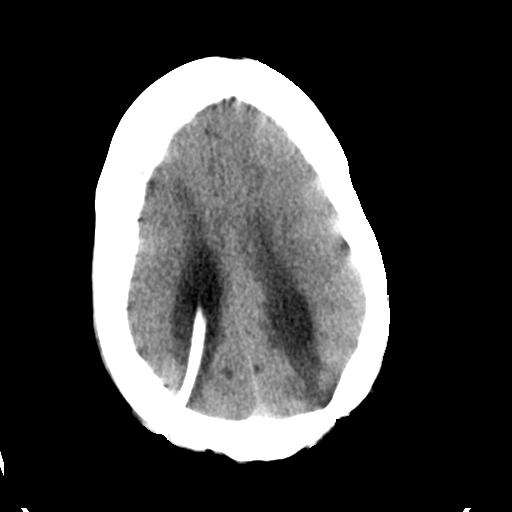
[im 21/31  brain]
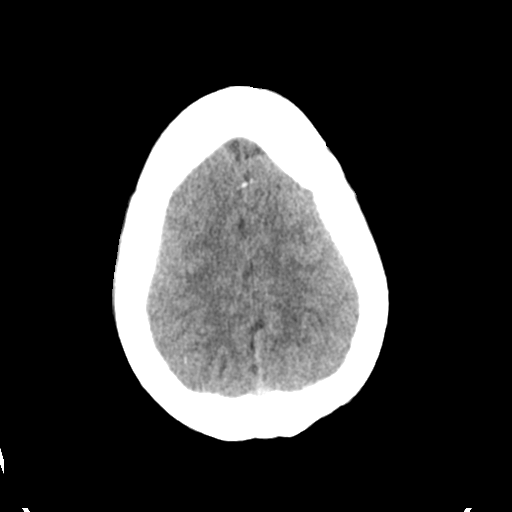
[im 23/31  brain]
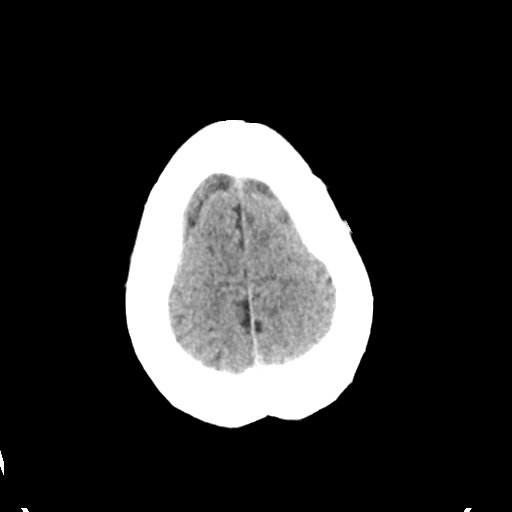
[im 23/31  bone]
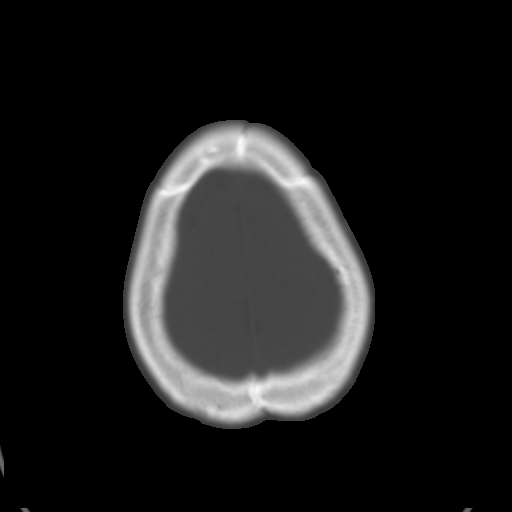
[im 26/31  brain]
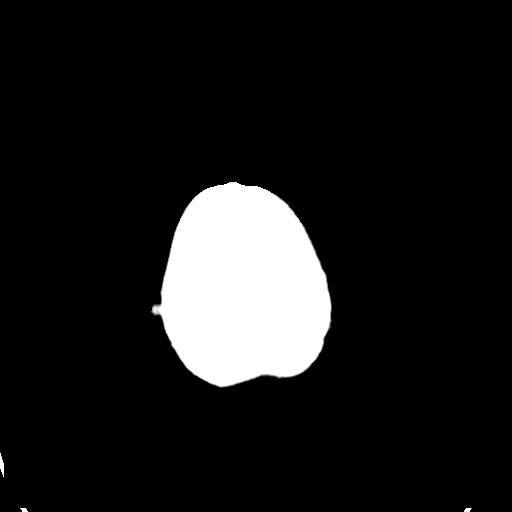
[im 28/31  brain]
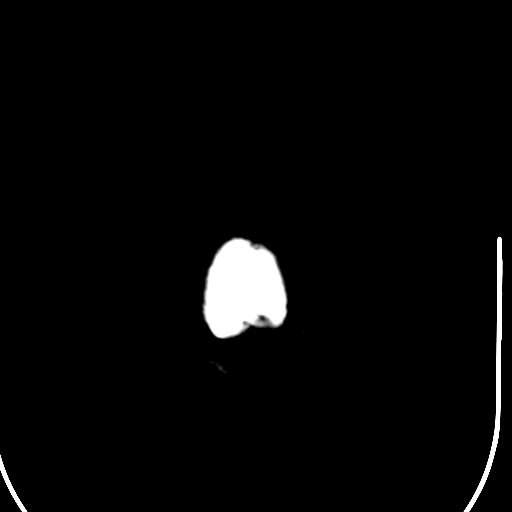

[11 of 30 positions shown; findings below may reference images not displayed]

FINDINGS: New opacification of the dominant right sphenoid sinus. Some
hyperdense retained secretions. Other Visualized paranasal sinuses
and mastoids are clear.

Stable visualized osseous structures. Right posterior approach burr
hole and shunt. Superficial scalp soft tissues are stable and within
normal limits. Visualized orbit soft tissues are within normal
limits.

Moderate to severe ventriculomegaly, of the change from the prior
study in 6998 at which time the lateral and third ventricles were
almost completely decompressed. Abnormal periventricular hypodensity
in keeping with transependymal edema including at the temporal
horns. Intracranial portion of the CSF shunt appears stable in
position, terminating near the anterior septum callosum on the
right.

No extra-axial fluid collection identified. No discrete brain mass
identified. No evidence of cortically based acute infarction
identified. No acute intracranial hemorrhage identified.
IMPRESSION: 1. Shunt malfunction with acute ventriculomegaly and transependymal
edema.
2. Shunt positioning appears stable since [DATE]. New sphenoid sinus disease.

## 2016-07-09 ENCOUNTER — Ambulatory Visit (INDEPENDENT_AMBULATORY_CARE_PROVIDER_SITE_OTHER): Payer: BC Managed Care – PPO | Admitting: Psychology

## 2016-07-09 DIAGNOSIS — F319 Bipolar disorder, unspecified: Secondary | ICD-10-CM | POA: Diagnosis not present

## 2016-07-23 ENCOUNTER — Ambulatory Visit (INDEPENDENT_AMBULATORY_CARE_PROVIDER_SITE_OTHER): Payer: BC Managed Care – PPO | Admitting: Psychology

## 2016-07-23 DIAGNOSIS — F3181 Bipolar II disorder: Secondary | ICD-10-CM

## 2016-07-23 DIAGNOSIS — F89 Unspecified disorder of psychological development: Secondary | ICD-10-CM | POA: Diagnosis not present

## 2016-08-06 ENCOUNTER — Ambulatory Visit (INDEPENDENT_AMBULATORY_CARE_PROVIDER_SITE_OTHER): Payer: BC Managed Care – PPO | Admitting: Psychology

## 2016-08-06 DIAGNOSIS — F3181 Bipolar II disorder: Secondary | ICD-10-CM | POA: Diagnosis not present

## 2016-08-06 DIAGNOSIS — F89 Unspecified disorder of psychological development: Secondary | ICD-10-CM | POA: Diagnosis not present

## 2016-09-24 ENCOUNTER — Ambulatory Visit (INDEPENDENT_AMBULATORY_CARE_PROVIDER_SITE_OTHER): Payer: BC Managed Care – PPO | Admitting: Psychology

## 2016-09-24 DIAGNOSIS — F89 Unspecified disorder of psychological development: Secondary | ICD-10-CM

## 2016-09-24 DIAGNOSIS — F3181 Bipolar II disorder: Secondary | ICD-10-CM | POA: Diagnosis not present

## 2016-10-06 ENCOUNTER — Ambulatory Visit (INDEPENDENT_AMBULATORY_CARE_PROVIDER_SITE_OTHER): Payer: BC Managed Care – PPO | Admitting: Psychology

## 2016-10-06 DIAGNOSIS — F3181 Bipolar II disorder: Secondary | ICD-10-CM | POA: Diagnosis not present

## 2016-10-06 DIAGNOSIS — F89 Unspecified disorder of psychological development: Secondary | ICD-10-CM

## 2016-10-22 ENCOUNTER — Ambulatory Visit (INDEPENDENT_AMBULATORY_CARE_PROVIDER_SITE_OTHER): Payer: BC Managed Care – PPO | Admitting: Psychology

## 2016-10-22 DIAGNOSIS — F89 Unspecified disorder of psychological development: Secondary | ICD-10-CM

## 2016-10-22 DIAGNOSIS — F3181 Bipolar II disorder: Secondary | ICD-10-CM

## 2016-12-17 ENCOUNTER — Ambulatory Visit (INDEPENDENT_AMBULATORY_CARE_PROVIDER_SITE_OTHER): Payer: BC Managed Care – PPO | Admitting: Psychology

## 2016-12-17 DIAGNOSIS — F3181 Bipolar II disorder: Secondary | ICD-10-CM

## 2016-12-17 DIAGNOSIS — F89 Unspecified disorder of psychological development: Secondary | ICD-10-CM

## 2017-01-10 ENCOUNTER — Ambulatory Visit (INDEPENDENT_AMBULATORY_CARE_PROVIDER_SITE_OTHER): Payer: BC Managed Care – PPO | Admitting: Psychology

## 2017-01-10 DIAGNOSIS — F3181 Bipolar II disorder: Secondary | ICD-10-CM | POA: Diagnosis not present

## 2017-01-10 DIAGNOSIS — F89 Unspecified disorder of psychological development: Secondary | ICD-10-CM | POA: Diagnosis not present

## 2017-02-12 ENCOUNTER — Ambulatory Visit (INDEPENDENT_AMBULATORY_CARE_PROVIDER_SITE_OTHER): Payer: BC Managed Care – PPO | Admitting: Psychology

## 2017-02-12 DIAGNOSIS — F89 Unspecified disorder of psychological development: Secondary | ICD-10-CM | POA: Diagnosis not present

## 2017-02-12 DIAGNOSIS — F3181 Bipolar II disorder: Secondary | ICD-10-CM | POA: Diagnosis not present

## 2017-03-25 ENCOUNTER — Ambulatory Visit (INDEPENDENT_AMBULATORY_CARE_PROVIDER_SITE_OTHER): Payer: BC Managed Care – PPO | Admitting: Psychology

## 2017-03-25 DIAGNOSIS — F3181 Bipolar II disorder: Secondary | ICD-10-CM | POA: Diagnosis not present

## 2017-03-25 DIAGNOSIS — F89 Unspecified disorder of psychological development: Secondary | ICD-10-CM | POA: Diagnosis not present

## 2017-04-15 ENCOUNTER — Ambulatory Visit (INDEPENDENT_AMBULATORY_CARE_PROVIDER_SITE_OTHER): Payer: BC Managed Care – PPO | Admitting: Psychology

## 2017-04-15 DIAGNOSIS — F79 Unspecified intellectual disabilities: Secondary | ICD-10-CM

## 2017-04-15 DIAGNOSIS — F3181 Bipolar II disorder: Secondary | ICD-10-CM | POA: Diagnosis not present

## 2017-05-20 ENCOUNTER — Ambulatory Visit (INDEPENDENT_AMBULATORY_CARE_PROVIDER_SITE_OTHER): Payer: BC Managed Care – PPO | Admitting: Psychology

## 2017-05-20 DIAGNOSIS — F3181 Bipolar II disorder: Secondary | ICD-10-CM

## 2017-05-20 DIAGNOSIS — F79 Unspecified intellectual disabilities: Secondary | ICD-10-CM

## 2017-06-03 ENCOUNTER — Ambulatory Visit (INDEPENDENT_AMBULATORY_CARE_PROVIDER_SITE_OTHER): Payer: BC Managed Care – PPO | Admitting: Psychology

## 2017-06-03 DIAGNOSIS — F79 Unspecified intellectual disabilities: Secondary | ICD-10-CM

## 2017-06-03 DIAGNOSIS — F3181 Bipolar II disorder: Secondary | ICD-10-CM

## 2017-06-19 ENCOUNTER — Ambulatory Visit (INDEPENDENT_AMBULATORY_CARE_PROVIDER_SITE_OTHER): Payer: BC Managed Care – PPO | Admitting: Psychology

## 2017-06-19 DIAGNOSIS — F3181 Bipolar II disorder: Secondary | ICD-10-CM | POA: Diagnosis not present

## 2017-06-19 DIAGNOSIS — F79 Unspecified intellectual disabilities: Secondary | ICD-10-CM

## 2017-07-01 ENCOUNTER — Ambulatory Visit (INDEPENDENT_AMBULATORY_CARE_PROVIDER_SITE_OTHER): Payer: BC Managed Care – PPO | Admitting: Psychology

## 2017-07-01 DIAGNOSIS — F3181 Bipolar II disorder: Secondary | ICD-10-CM | POA: Diagnosis not present

## 2017-07-01 DIAGNOSIS — F79 Unspecified intellectual disabilities: Secondary | ICD-10-CM

## 2017-07-29 ENCOUNTER — Ambulatory Visit (INDEPENDENT_AMBULATORY_CARE_PROVIDER_SITE_OTHER): Payer: BC Managed Care – PPO | Admitting: Psychology

## 2017-07-29 DIAGNOSIS — F3181 Bipolar II disorder: Secondary | ICD-10-CM | POA: Diagnosis not present

## 2017-07-29 DIAGNOSIS — F79 Unspecified intellectual disabilities: Secondary | ICD-10-CM

## 2017-07-30 ENCOUNTER — Ambulatory Visit: Payer: BC Managed Care – PPO | Admitting: Psychology

## 2017-08-26 ENCOUNTER — Ambulatory Visit: Payer: Self-pay | Admitting: Psychology

## 2017-09-16 ENCOUNTER — Ambulatory Visit: Payer: BC Managed Care – PPO | Admitting: Psychology

## 2017-09-23 ENCOUNTER — Ambulatory Visit: Payer: Self-pay | Admitting: Psychology

## 2017-10-07 ENCOUNTER — Ambulatory Visit (INDEPENDENT_AMBULATORY_CARE_PROVIDER_SITE_OTHER): Payer: BC Managed Care – PPO | Admitting: Psychology

## 2017-10-07 DIAGNOSIS — F70 Mild intellectual disabilities: Secondary | ICD-10-CM | POA: Diagnosis not present

## 2017-10-07 DIAGNOSIS — F3181 Bipolar II disorder: Secondary | ICD-10-CM

## 2017-10-21 ENCOUNTER — Ambulatory Visit (INDEPENDENT_AMBULATORY_CARE_PROVIDER_SITE_OTHER): Payer: BC Managed Care – PPO | Admitting: Psychology

## 2017-10-21 DIAGNOSIS — F79 Unspecified intellectual disabilities: Secondary | ICD-10-CM | POA: Diagnosis not present

## 2017-10-21 DIAGNOSIS — F3181 Bipolar II disorder: Secondary | ICD-10-CM | POA: Diagnosis not present

## 2017-11-18 ENCOUNTER — Ambulatory Visit (INDEPENDENT_AMBULATORY_CARE_PROVIDER_SITE_OTHER): Payer: BC Managed Care – PPO | Admitting: Psychology

## 2017-11-18 DIAGNOSIS — F3181 Bipolar II disorder: Secondary | ICD-10-CM

## 2017-11-18 DIAGNOSIS — F70 Mild intellectual disabilities: Secondary | ICD-10-CM

## 2017-12-16 ENCOUNTER — Ambulatory Visit (INDEPENDENT_AMBULATORY_CARE_PROVIDER_SITE_OTHER): Payer: BC Managed Care – PPO | Admitting: Psychology

## 2017-12-16 DIAGNOSIS — F3181 Bipolar II disorder: Secondary | ICD-10-CM

## 2017-12-16 DIAGNOSIS — F70 Mild intellectual disabilities: Secondary | ICD-10-CM

## 2018-01-13 ENCOUNTER — Ambulatory Visit (INDEPENDENT_AMBULATORY_CARE_PROVIDER_SITE_OTHER): Payer: BC Managed Care – PPO | Admitting: Psychology

## 2018-01-13 DIAGNOSIS — F3181 Bipolar II disorder: Secondary | ICD-10-CM

## 2018-01-13 DIAGNOSIS — F70 Mild intellectual disabilities: Secondary | ICD-10-CM | POA: Diagnosis not present

## 2018-02-10 ENCOUNTER — Ambulatory Visit (INDEPENDENT_AMBULATORY_CARE_PROVIDER_SITE_OTHER): Payer: BC Managed Care – PPO | Admitting: Psychology

## 2018-02-10 DIAGNOSIS — F3181 Bipolar II disorder: Secondary | ICD-10-CM

## 2018-02-10 DIAGNOSIS — F70 Mild intellectual disabilities: Secondary | ICD-10-CM | POA: Diagnosis not present

## 2018-03-10 ENCOUNTER — Ambulatory Visit: Payer: Self-pay | Admitting: Psychology

## 2018-04-07 ENCOUNTER — Ambulatory Visit: Payer: Self-pay | Admitting: Psychology

## 2018-05-05 ENCOUNTER — Ambulatory Visit: Payer: BC Managed Care – PPO | Admitting: Psychology

## 2018-06-02 ENCOUNTER — Ambulatory Visit: Payer: BC Managed Care – PPO | Admitting: Psychology

## 2018-06-30 ENCOUNTER — Ambulatory Visit: Payer: BC Managed Care – PPO | Admitting: Psychology

## 2018-07-28 ENCOUNTER — Ambulatory Visit: Payer: BC Managed Care – PPO | Admitting: Psychology

## 2018-08-25 ENCOUNTER — Ambulatory Visit: Payer: BC Managed Care – PPO | Admitting: Psychology

## 2018-09-22 ENCOUNTER — Ambulatory Visit: Payer: BC Managed Care – PPO | Admitting: Psychology

## 2018-11-17 ENCOUNTER — Ambulatory Visit: Payer: BC Managed Care – PPO | Admitting: Psychology

## 2018-12-15 ENCOUNTER — Ambulatory Visit: Payer: BC Managed Care – PPO | Admitting: Psychology

## 2019-01-12 ENCOUNTER — Ambulatory Visit: Payer: BC Managed Care – PPO | Admitting: Psychology

## 2019-02-09 ENCOUNTER — Ambulatory Visit: Payer: BC Managed Care – PPO | Admitting: Psychology

## 2019-03-09 ENCOUNTER — Ambulatory Visit: Payer: BC Managed Care – PPO | Admitting: Psychology

## 2019-04-06 ENCOUNTER — Ambulatory Visit: Payer: BC Managed Care – PPO | Admitting: Psychology

## 2019-06-01 ENCOUNTER — Ambulatory Visit: Payer: BC Managed Care – PPO | Admitting: Psychology

## 2019-06-29 ENCOUNTER — Ambulatory Visit: Payer: BC Managed Care – PPO | Admitting: Psychology

## 2020-11-05 ENCOUNTER — Ambulatory Visit (INDEPENDENT_AMBULATORY_CARE_PROVIDER_SITE_OTHER): Payer: BC Managed Care – PPO | Admitting: Psychology

## 2020-11-05 DIAGNOSIS — F70 Mild intellectual disabilities: Secondary | ICD-10-CM

## 2020-11-05 DIAGNOSIS — F3181 Bipolar II disorder: Secondary | ICD-10-CM | POA: Diagnosis not present

## 2022-10-07 ENCOUNTER — Other Ambulatory Visit (HOSPITAL_COMMUNITY): Payer: Self-pay | Admitting: Internal Medicine

## 2022-10-07 DIAGNOSIS — E785 Hyperlipidemia, unspecified: Secondary | ICD-10-CM

## 2022-10-17 ENCOUNTER — Ambulatory Visit (HOSPITAL_COMMUNITY)
Admission: RE | Admit: 2022-10-17 | Discharge: 2022-10-17 | Disposition: A | Discharge status: Home / Self Care | Payer: BC Managed Care – PPO | Source: Ambulatory Visit | Attending: Internal Medicine | Admitting: Internal Medicine

## 2022-10-17 DIAGNOSIS — E785 Hyperlipidemia, unspecified: Secondary | ICD-10-CM | POA: Insufficient documentation

## 2024-05-30 ENCOUNTER — Emergency Department (HOSPITAL_COMMUNITY)
Admission: EM | Admit: 2024-05-30 | Discharge: 2024-05-31 | Discharge status: Against Medical Advice | Attending: Emergency Medicine | Admitting: Emergency Medicine

## 2024-05-30 ENCOUNTER — Encounter (HOSPITAL_COMMUNITY): Payer: Self-pay | Admitting: Emergency Medicine

## 2024-05-30 ENCOUNTER — Other Ambulatory Visit: Payer: Self-pay

## 2024-05-30 DIAGNOSIS — Z5321 Procedure and treatment not carried out due to patient leaving prior to being seen by health care provider: Secondary | ICD-10-CM | POA: Insufficient documentation

## 2024-05-30 DIAGNOSIS — M545 Low back pain, unspecified: Secondary | ICD-10-CM | POA: Diagnosis present

## 2024-05-30 DIAGNOSIS — M4800 Spinal stenosis, site unspecified: Secondary | ICD-10-CM

## 2024-05-30 HISTORY — DX: Cerebral palsy, unspecified: G80.9

## 2024-05-30 HISTORY — DX: Spinal stenosis, site unspecified: M48.00

## 2024-05-30 NOTE — ED Triage Notes (Addendum)
 BIBA Per EMS: Pt coming from home w/ c/o lower back pain since 12pm today. Denies radiation to anywhere. Denies injury. Denies urinary s/s. Hx spinal stenosis.  141/80 111HR

## 2024-05-31 NOTE — ED Notes (Signed)
 Pts caregivers said they were going to leave since the pain is gone and he has an appointment in the morning. Triage nurse informed.

## 2024-05-31 NOTE — ED Notes (Signed)
 Went outside to look for pt and was told that pt and family left.
